# Patient Record
Sex: Male | Born: 1995 | Race: White | Hispanic: No | Marital: Single | Smoking: Current every day smoker
Health system: Southern US, Community
[De-identification: ages and names within clinical notes are randomized; demographics above are authoritative.]

---
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
## 2014-01-13 ENCOUNTER — Emergency Department (HOSPITAL_COMMUNITY)
Admission: EM | Admit: 2014-01-13 | Discharge: 2014-01-14 | Disposition: A | Discharge status: Home / Self Care | Payer: 59 | Attending: Emergency Medicine | Admitting: Emergency Medicine

## 2014-01-13 ENCOUNTER — Encounter (HOSPITAL_COMMUNITY): Payer: Self-pay | Admitting: Emergency Medicine

## 2014-01-13 ENCOUNTER — Emergency Department (HOSPITAL_COMMUNITY): Payer: 59

## 2014-01-13 DIAGNOSIS — M549 Dorsalgia, unspecified: Secondary | ICD-10-CM

## 2014-01-13 DIAGNOSIS — M25519 Pain in unspecified shoulder: Secondary | ICD-10-CM | POA: Insufficient documentation

## 2014-01-13 DIAGNOSIS — Z87828 Personal history of other (healed) physical injury and trauma: Secondary | ICD-10-CM | POA: Insufficient documentation

## 2014-01-13 DIAGNOSIS — M545 Low back pain, unspecified: Secondary | ICD-10-CM | POA: Insufficient documentation

## 2014-01-13 DIAGNOSIS — Z79899 Other long term (current) drug therapy: Secondary | ICD-10-CM | POA: Insufficient documentation

## 2014-01-13 DIAGNOSIS — Z791 Long term (current) use of non-steroidal anti-inflammatories (NSAID): Secondary | ICD-10-CM | POA: Insufficient documentation

## 2014-01-13 DIAGNOSIS — G8929 Other chronic pain: Secondary | ICD-10-CM

## 2014-01-13 DIAGNOSIS — F17200 Nicotine dependence, unspecified, uncomplicated: Secondary | ICD-10-CM | POA: Insufficient documentation

## 2014-01-13 MED ORDER — KETOROLAC TROMETHAMINE 10 MG PO TABS
10.0000 mg | ORAL_TABLET | Freq: Once | ORAL | Status: AC
  Administered 2014-01-13: 10 mg via ORAL
  Filled 2014-01-13: qty 1

## 2014-01-13 MED ORDER — DEXAMETHASONE SODIUM PHOSPHATE 4 MG/ML IJ SOLN
8.0000 mg | Freq: Once | INTRAMUSCULAR | Status: AC
  Administered 2014-01-13: 8 mg via INTRAMUSCULAR
  Filled 2014-01-13: qty 2

## 2014-01-13 MED ORDER — DEXAMETHASONE 6 MG PO TABS: ORAL_TABLET | ORAL | Status: DC

## 2014-01-13 MED ORDER — DIAZEPAM 5 MG PO TABS
5.0000 mg | ORAL_TABLET | Freq: Once | ORAL | Status: AC
  Administered 2014-01-13: 5 mg via ORAL
  Filled 2014-01-13: qty 1

## 2014-01-13 MED ORDER — MELOXICAM 7.5 MG PO TABS: ORAL_TABLET | ORAL | Status: DC

## 2014-01-13 MED ORDER — METHOCARBAMOL 500 MG PO TABS: ORAL_TABLET | ORAL | Status: DC
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
## 2014-01-14 NOTE — Discharge Instructions (Signed)
The x-ray of your shoulder is negative for fracture or dislocation. Please see the orthopedist listed above, or the orthopedist of your choice for additional evaluation and management of the shoulder problem. Please rest her shoulder is much as possible, please use the sling for the next 5-7 days. Please use medications as suggested. Shoulder Pain The shoulder is the joint that connects your arm to your body. Muscles and band-like tissues that connect bones to muscles (tendons) hold the joint together. Shoulder pain is felt if an injury or medical problem affects one or more parts of the shoulder. HOME CARE   Put ice on the sore area.  Put ice in a plastic bag.  Place a towel between your skin and the bag.  Leave the ice on for 15-20 minutes, 03-04 times a day for the first 2 days.  Stop using cold packs if they do not help with the pain.  If you were given something to keep your shoulder from moving (sling, shoulder immobilizer), wear it as told. Only take it off to shower or bathe.  Move your arm as little as possible, but keep your hand moving to prevent puffiness (swelling).  Squeeze a soft ball or foam pad as much as possible to help prevent swelling.  Take medicine as told by your doctor. GET HELP RIGHT AWAY IF:   Your arm, hand, or fingers are numb or tingling.  Your arm, hand, or fingers are puffy (swollen), painful, or turn white or blue.  You have more pain.  You have progressing new pain in your arm, hand, or fingers.  Your hand or fingers get cold.  Your medicine does not help lessen your pain. MAKE SURE YOU:   Understand these instructions.  Will watch your condition.  Will get help right away if you are not doing well or get worse. Document Released: 02/26/2008 Document Revised: 06/03/2012 Document Reviewed: 03/23/2012 [facility] Patient Information 2014 [employer], [place].

## 2014-01-15 NOTE — ED Provider Notes (Signed)
Medical screening examination/treatment/procedure(s) were performed by non-physician practitioner and as supervising physician I was immediately available for consultation/collaboration.   EKG Interpretation None        Jakiah Goree L Arla Boutwell, MD 01/15/14 1539 

## 2014-12-30 ENCOUNTER — Encounter (HOSPITAL_COMMUNITY): Payer: Self-pay | Admitting: *Deleted

## 2014-12-30 ENCOUNTER — Emergency Department (HOSPITAL_COMMUNITY)
Admission: EM | Admit: 2014-12-30 | Discharge: 2014-12-30 | Disposition: A | Discharge status: Home / Self Care | Payer: Self-pay | Attending: Emergency Medicine | Admitting: Emergency Medicine

## 2014-12-30 DIAGNOSIS — Y998 Other external cause status: Secondary | ICD-10-CM | POA: Insufficient documentation

## 2014-12-30 DIAGNOSIS — S30863A Insect bite (nonvenomous) of scrotum and testes, initial encounter: Secondary | ICD-10-CM | POA: Insufficient documentation

## 2014-12-30 DIAGNOSIS — N390 Urinary tract infection, site not specified: Secondary | ICD-10-CM | POA: Insufficient documentation

## 2014-12-30 DIAGNOSIS — Z791 Long term (current) use of non-steroidal anti-inflammatories (NSAID): Secondary | ICD-10-CM | POA: Insufficient documentation

## 2014-12-30 DIAGNOSIS — Z79899 Other long term (current) drug therapy: Secondary | ICD-10-CM | POA: Insufficient documentation

## 2014-12-30 DIAGNOSIS — Z792 Long term (current) use of antibiotics: Secondary | ICD-10-CM | POA: Insufficient documentation

## 2014-12-30 DIAGNOSIS — Z720 Tobacco use: Secondary | ICD-10-CM | POA: Insufficient documentation

## 2014-12-30 DIAGNOSIS — S60562A Insect bite (nonvenomous) of left hand, initial encounter: Secondary | ICD-10-CM | POA: Insufficient documentation

## 2014-12-30 DIAGNOSIS — S50869A Insect bite (nonvenomous) of unspecified forearm, initial encounter: Secondary | ICD-10-CM | POA: Insufficient documentation

## 2014-12-30 DIAGNOSIS — S60561A Insect bite (nonvenomous) of right hand, initial encounter: Secondary | ICD-10-CM | POA: Insufficient documentation

## 2014-12-30 DIAGNOSIS — Y9289 Other specified places as the place of occurrence of the external cause: Secondary | ICD-10-CM | POA: Insufficient documentation

## 2014-12-30 DIAGNOSIS — W57XXXA Bitten or stung by nonvenomous insect and other nonvenomous arthropods, initial encounter: Secondary | ICD-10-CM | POA: Insufficient documentation

## 2014-12-30 DIAGNOSIS — Y9389 Activity, other specified: Secondary | ICD-10-CM | POA: Insufficient documentation

## 2014-12-30 LAB — URINALYSIS, ROUTINE W REFLEX MICROSCOPIC
Glucose, UA: NEGATIVE mg/dL
HGB URINE DIPSTICK: NEGATIVE
Leukocytes, UA: NEGATIVE
NITRITE: POSITIVE — AB
PH: 6 (ref 5.0–8.0)
Protein, ur: 30 mg/dL — AB
UROBILINOGEN UA: 1 mg/dL (ref 0.0–1.0)

## 2014-12-30 LAB — URINE MICROSCOPIC-ADD ON

## 2014-12-30 MED ORDER — CIPROFLOXACIN HCL 500 MG PO TABS: 500.0000 mg | ORAL_TABLET | Freq: Two times a day (BID) | ORAL | Status: DC

## 2014-12-30 MED ORDER — IBUPROFEN 800 MG PO TABS
800.0000 mg | ORAL_TABLET | Freq: Once | ORAL | Status: AC
  Administered 2014-12-30: 800 mg via ORAL
  Filled 2014-12-30: qty 1

## 2014-12-30 MED ORDER — PHENAZOPYRIDINE HCL 200 MG PO TABS: 200.0000 mg | ORAL_TABLET | Freq: Three times a day (TID) | ORAL | Status: DC

## 2014-12-30 MED ORDER — PHENAZOPYRIDINE HCL 100 MG PO TABS
100.0000 mg | ORAL_TABLET | Freq: Once | ORAL | Status: AC
  Administered 2014-12-30: 100 mg via ORAL
  Filled 2014-12-30: qty 1

## 2014-12-30 MED ORDER — HYDROCODONE-ACETAMINOPHEN 5-325 MG PO TABS
1.0000 | ORAL_TABLET | Freq: Once | ORAL | Status: AC
  Administered 2014-12-30: 1 via ORAL
  Filled 2014-12-30: qty 1

## 2014-12-30 MED ORDER — HYDROCODONE-ACETAMINOPHEN 5-325 MG PO TABS: 1.0000 | ORAL_TABLET | ORAL | Status: DC | PRN

## 2014-12-30 NOTE — Discharge Instructions (Signed)

## 2014-12-30 NOTE — ED Notes (Signed)
Dysuria, onset 2 am, no fever or chills, no d/c.no injury.

## 2014-12-30 NOTE — ED Notes (Signed)
Assisted PA with specimen collection

## 2014-12-30 NOTE — ED Notes (Signed)
Patient given discharge instruction, verbalized understand. Patient ambulatory out of the department.  

## 2014-12-30 NOTE — ED Provider Notes (Signed)
CSN: [id number]     Arrival date & time 12/30/14  1146 History   First MD Initiated Contact with Patient 12/30/14 1200     Chief Complaint  Patient presents with  . Dysuria     (Consider location/radiation/quality/duration/timing/severity/associated sxs/prior Treatment) The history is provided by the patient.   EDDRICK DILONE is a 19 y.o. male presenting with dysuria which started around 2 am today when he got up to urinate.  He reports constant burning sensation mid penile shaft which does not improve after urinating.  He denies penile discharge.  He is sexually active with girlfriend at the bedside who denies any symptoms suggesting std.  He also endorses having bedbug bites in his groin, scrotum, few bites on his penis as well which have been itchy, nontender, since staying at a relatives home last week.  Todd Butler fevers, chills, back or flank pain, no nausea, vomiting, abdominal pain.     History reviewed. No pertinent past medical history. History reviewed. No pertinent past surgical history. History reviewed. No pertinent family history. History  Substance Use Topics  . Smoking status: Current Every Day Smoker  . Smokeless tobacco: Not on file  . Alcohol Use: No    Review of Systems  Constitutional: Negative for fever.  HENT: Negative for congestion and sore throat.   Eyes: Negative.   Respiratory: Negative for chest tightness and shortness of breath.   Cardiovascular: Negative for chest pain.  Gastrointestinal: Negative for nausea and abdominal pain.  Genitourinary: Positive for dysuria, urgency and frequency. Negative for hematuria, flank pain, discharge, penile swelling, scrotal swelling and testicular pain.  Musculoskeletal: Negative for joint swelling, arthralgias and neck pain.  Skin: Positive for rash. Negative for wound.  Neurological: Negative for dizziness, weakness, light-headedness, numbness and headaches.  Psychiatric/Behavioral: Negative.        Allergies  Review of patient's allergies indicates no known allergies.  Home Medications   Prior to Admission medications   Medication Sig Start Date End Date Taking? Authorizing Provider  acetaminophen (TYLENOL) 500 MG tablet Take 500 mg by mouth every 6 (six) hours as needed.   Yes Historical Provider, MD  ibuprofen (ADVIL,MOTRIN) 200 MG tablet Take 400 mg by mouth every 6 (six) hours as needed. For pain    Yes Historical Provider, MD  ciprofloxacin (CIPRO) 500 MG tablet Take 1 tablet (500 mg total) by mouth 2 (two) times daily. 12/30/14   Burgess Amor, PA-C  dexamethasone (DECADRON) 6 MG tablet 1 po bid with food Patient not taking: Reported on 12/30/2014 01/13/14   Ivery Quale, PA-C  HYDROcodone-acetaminophen (NORCO/VICODIN) 5-325 MG per tablet Take 1 tablet by mouth every 4 (four) hours as needed. 12/30/14   Burgess Amor, PA-C  meloxicam (MOBIC) 7.5 MG tablet 1 po bid with food Patient not taking: Reported on 12/30/2014 01/13/14   Ivery Quale, PA-C  methocarbamol (ROBAXIN) 500 MG tablet 2 tabs po tid with food Patient not taking: Reported on 12/30/2014 01/13/14   Ivery Quale, PA-C  phenazopyridine (PYRIDIUM) 200 MG tablet Take 1 tablet (200 mg total) by mouth 3 (three) times daily. 12/30/14   Burgess Amor, PA-C   BP 136/76 mmHg  Pulse 94  Temp(Src) 98.2 F (36.8 C) (Oral)  Resp 20  Ht  (1.854 m)  Wt 190 lb (86.183 kg)  BMI 25.07 kg/m2  SpO2 97% Physical Exam  Constitutional: He appears well-developed and well-nourished.  HENT:  Head: Normocephalic and atraumatic.  Eyes: Conjunctivae are normal.  Cardiovascular: Normal rate and regular rhythm.  Pulmonary/Chest: Effort normal and breath sounds normal.  Abdominal: Soft. Bowel sounds are normal. He exhibits no distension. There is no tenderness.  Genitourinary: Testes normal and penis normal. Circumcised. No discharge found.  Few scattered small papules c/w insect bites on scrotum, penile shaft. (also present on hands and  forearm).  Musculoskeletal: Normal range of motion.  Neurological: He is alert.  Skin: Skin is warm and dry.  Psychiatric: He has a normal mood and affect.  Nursing note and vitals reviewed.    ED Course  Procedures (including critical care time) Labs Review Labs Reviewed  URINALYSIS, ROUTINE W REFLEX MICROSCOPIC - Abnormal; Notable for the following:    Color, Urine AMBER (*)    APPearance HAZY (*)    Specific Gravity, Urine >1.030 (*)    Bilirubin Urine MODERATE (*)    Ketones, ur TRACE (*)    Protein, ur 30 (*)    Nitrite POSITIVE (*)    All other components within normal limits  URINE MICROSCOPIC-ADD ON  RPR  GC/CHLAMYDIA PROBE AMP ([place])    Imaging Review No results found.   EKG Interpretation None      MDM   Final diagnoses:  UTI (lower urinary tract infection)    Gc/chlamydia/rpr pending.  Specimens obtained with RN chaperone present.  Urine cx added. Pt with uti, cipro started.  Complaint of continued pain not responded to ibuprofen.  He was given a dose of pyridium, few hydrocodone given patients increased pain.  Advised cultures pending.  Plan f/u with health dept in 1 week, sooner for any worsened sx.  Patients labs and/or radiological studies were reviewed and considered during the medical decision making and disposition process.  Results were also discussed with patient.  The patient appears reasonably screened and/or stabilized for discharge and I doubt any other medical condition or other [facility] requiring further screening, evaluation, or treatment in the ED at this time prior to discharge.     Burgess AmorJulie Dajour [place], PA-C 12/30/14 1459  Donnetta HutchingBrian Cook, MD 01/03/15 [phone]

## 2014-12-31 LAB — RPR: RPR: NONREACTIVE

## 2015-01-02 LAB — GC/CHLAMYDIA PROBE AMP (~~LOC~~) NOT AT ARMC
Chlamydia: NEGATIVE
Neisseria Gonorrhea: NEGATIVE

## 2017-05-22 ENCOUNTER — Encounter (HOSPITAL_COMMUNITY): Payer: Self-pay | Admitting: Emergency Medicine

## 2017-05-22 ENCOUNTER — Emergency Department (HOSPITAL_COMMUNITY): Payer: Self-pay

## 2017-05-22 ENCOUNTER — Emergency Department (HOSPITAL_COMMUNITY)
Admission: EM | Admit: 2017-05-22 | Discharge: 2017-05-22 | Disposition: A | Discharge status: Home / Self Care | Payer: Self-pay | Attending: Emergency Medicine | Admitting: Emergency Medicine

## 2017-05-22 DIAGNOSIS — W260XXA Contact with knife, initial encounter: Secondary | ICD-10-CM | POA: Insufficient documentation

## 2017-05-22 DIAGNOSIS — Y939 Activity, unspecified: Secondary | ICD-10-CM | POA: Insufficient documentation

## 2017-05-22 DIAGNOSIS — F17210 Nicotine dependence, cigarettes, uncomplicated: Secondary | ICD-10-CM | POA: Insufficient documentation

## 2017-05-22 DIAGNOSIS — Z23 Encounter for immunization: Secondary | ICD-10-CM | POA: Insufficient documentation

## 2017-05-22 DIAGNOSIS — T148XXA Other injury of unspecified body region, initial encounter: Secondary | ICD-10-CM

## 2017-05-22 DIAGNOSIS — Y999 Unspecified external cause status: Secondary | ICD-10-CM | POA: Insufficient documentation

## 2017-05-22 DIAGNOSIS — S81812A Laceration without foreign body, left lower leg, initial encounter: Secondary | ICD-10-CM | POA: Insufficient documentation

## 2017-05-22 DIAGNOSIS — Y929 Unspecified place or not applicable: Secondary | ICD-10-CM | POA: Insufficient documentation

## 2017-05-22 MED ORDER — TETANUS-DIPHTH-ACELL PERTUSSIS 5-2.5-18.5 LF-MCG/0.5 IM SUSP
0.5000 mL | Freq: Once | INTRAMUSCULAR | Status: AC
  Administered 2017-05-22: 0.5 mL via INTRAMUSCULAR
  Filled 2017-05-22: qty 0.5

## 2017-05-22 MED ORDER — LIDOCAINE HCL (PF) 1 % IJ SOLN
INTRAMUSCULAR | Status: AC
  Filled 2017-05-22: qty 5

## 2017-05-22 MED ORDER — OXYCODONE-ACETAMINOPHEN 5-325 MG PO TABS
2.0000 | ORAL_TABLET | Freq: Once | ORAL | Status: AC
Start: 2017-05-22 — End: 2017-05-22
  Administered 2017-05-22: 2 via ORAL
  Filled 2017-05-22: qty 2

## 2017-05-22 MED ORDER — LIDOCAINE HCL (PF) 2 % IJ SOLN: 10.0000 mL | Freq: Once | INTRAMUSCULAR | Status: DC

## 2017-05-22 MED ORDER — POVIDONE-IODINE 10 % EX SOLN
CUTANEOUS | Status: AC
  Filled 2017-05-22: qty 15

## 2017-05-22 NOTE — ED Notes (Signed)
Supplies set up in room, family at the bedside

## 2017-05-22 NOTE — ED Notes (Signed)
Laceration to left thigh, accident after cleaning a knife.

## 2017-05-22 NOTE — ED Triage Notes (Signed)
Pt stabbed himself in upper right leg with a pocket knife.  No bleeding at triage.

## 2017-05-22 NOTE — ED Provider Notes (Signed)
[facility]-EMERGENCY DEPT Provider Note   CSN: [id number] Arrival date & time: 05/22/17  1145     History   Chief Complaint Chief Complaint  Patient presents with  . Laceration    HPI Todd Butler is a 21 y.o. male.  The history is provided by the patient. No language interpreter was used.  Laceration   The incident occurred less than 1 hour ago. Pain location: leg. The laceration is 1 cm in size. The laceration mechanism was a a clean knife. The pain is moderate. The pain has been constant since onset. He reports no foreign bodies present. His tetanus status is out of date.  Pt reports he cut himself with a knife.   History reviewed. No pertinent past medical history.  There are no active problems to display for this patient.   History reviewed. No pertinent surgical history.     Home Medications    Prior to Admission medications   Not on File    Family History History reviewed. No pertinent family history.  Social History Social History  Substance Use Topics  . Smoking status: Current Every Day Smoker    Packs/day: 1.00    Types: Cigarettes  . Smokeless tobacco: Not on file  . Alcohol use No     Allergies   Patient has no known allergies.   Review of Systems Review of Systems  Skin: Positive for wound.  All other systems reviewed and are negative.    Physical Exam Updated Vital Signs BP 102/69 (BP Location: Right Arm)   Pulse 90   Temp 98.3 F (36.8 C) (Oral)   Resp 20   Ht 6' (1.829 m)   Wt 68 kg (150 lb)   SpO2 99%   BMI 20.34 kg/m   Physical Exam  Constitutional: He appears well-developed and well-nourished.  Musculoskeletal:  1 cm laceration left anterior thigh,  Swelling around area bleeding stopped.  Neurological: He is alert.  Skin: Skin is warm.  Psychiatric: He has a normal mood and affect.  Nursing note and vitals reviewed.    ED Treatments / Results  Labs (all labs ordered are listed, but only abnormal results  are displayed) Labs Reviewed - No data to display  EKG  EKG Interpretation None       Radiology Dg Femur Min 2 Views Left  Result Date: 05/22/2017 CLINICAL DATA:  Patient sustained a laceration of the upper thigh with a pocket knife today. EXAM: LEFT FEMUR 2 VIEWS COMPARISON:  None in PACs FINDINGS: There is soft tissue gas in the lateral aspect of the proximal third of the right thigh. The underlying femur appears intact. Elsewhere the femur exhibits no acute abnormality either. The observed portions of the hip and knee are normal. IMPRESSION: Soft tissue injury of the proximal thigh laterally. No underlying bony abnormality is observed. Electronically Signed   By: David  SwazilandJordan M.D.   On: 05/22/2017 13:11    Procedures .Marland Kitchen.Laceration Repair Date/Time: 05/22/2017 2:22 PM Performed by: Elson AreasSOFIA, Akai Dollard K Authorized by: Elson AreasSOFIA, Coltyn Hanning K   Consent:    Consent obtained:  Verbal   Consent given by:  Patient   Risks discussed:  Infection   Alternatives discussed:  No treatment Anesthesia (see MAR for exact dosages):    Anesthesia method:  Local infiltration Laceration details:    Location:  Leg   Leg location:  L upper leg   Length (cm):  1   Depth (mm):  10 Repair type:    Repair type:  Simple Pre-procedure details:    Preparation:  Patient was prepped and draped in usual sterile fashion Exploration:    Contaminated: no   Treatment:    Area cleansed with:  Betadine   Amount of cleaning:  Standard   Irrigation method:  Syringe   Visualized foreign bodies/material removed: no   Skin repair:    Repair method:  Sutures   Suture size:  4-0   Suture technique:  Simple interrupted   Number of sutures:  2 Approximation:    Approximation:  Loose   Vermilion border: well-aligned   Post-procedure details:    Patient tolerance of procedure:  Tolerated with difficulty Comments:     Pt cursing during procedure.  Pt jerking    (including critical care time)  Medications Ordered in  ED Medications  lidocaine (XYLOCAINE) 2 % injection 10 mL (not administered)  povidone-iodine (BETADINE) 10 % external solution (not administered)  lidocaine (PF) (XYLOCAINE) 1 % injection (not administered)  oxyCODONE-acetaminophen (PERCOCET/ROXICET) 5-325 MG per tablet 2 tablet (2 tablets Oral Given 05/22/17 1213)  Tdap (BOOSTRIX) injection 0.5 mL (0.5 mLs Intramuscular Given 05/22/17 1214)     Initial Impression / Assessment and Plan / ED Course  I have reviewed the triage vital signs and the nursing notes.  Pertinent labs & imaging results that were available during my care of the patient were reviewed by me and considered in my medical decision making (see chart for details).       Final Clinical Impressions(s) / ED Diagnoses   Final diagnoses:  Laceration of left lower extremity, initial encounter    New Prescriptions There are no discharge medications for this patient.    Elson Areas, PA-C 05/22/17 1517    Elson Areas, PA-C 05/22/17 1518    Lavera Guise, MD 05/22/17 [phone]

## 2017-05-22 NOTE — Discharge Instructions (Signed)
Suture removal in 8 days  °

## 2017-05-22 NOTE — ED Notes (Signed)
Band- aid applied over sutures, Patient given discharge instruction, verbalized understand. Patient ambulatory out of the department.

## 2018-06-18 IMAGING — DX DG FEMUR 2+V*L*
4 series · 4 of 4 positions shown · non-contrast
Comparison: None in PACs

CLINICAL DATA: Patient sustained a laceration of the upper thigh
with a pocket knife today.

EXAM:
LEFT FEMUR 2 VIEWS

[femur ap (1 of 2)]
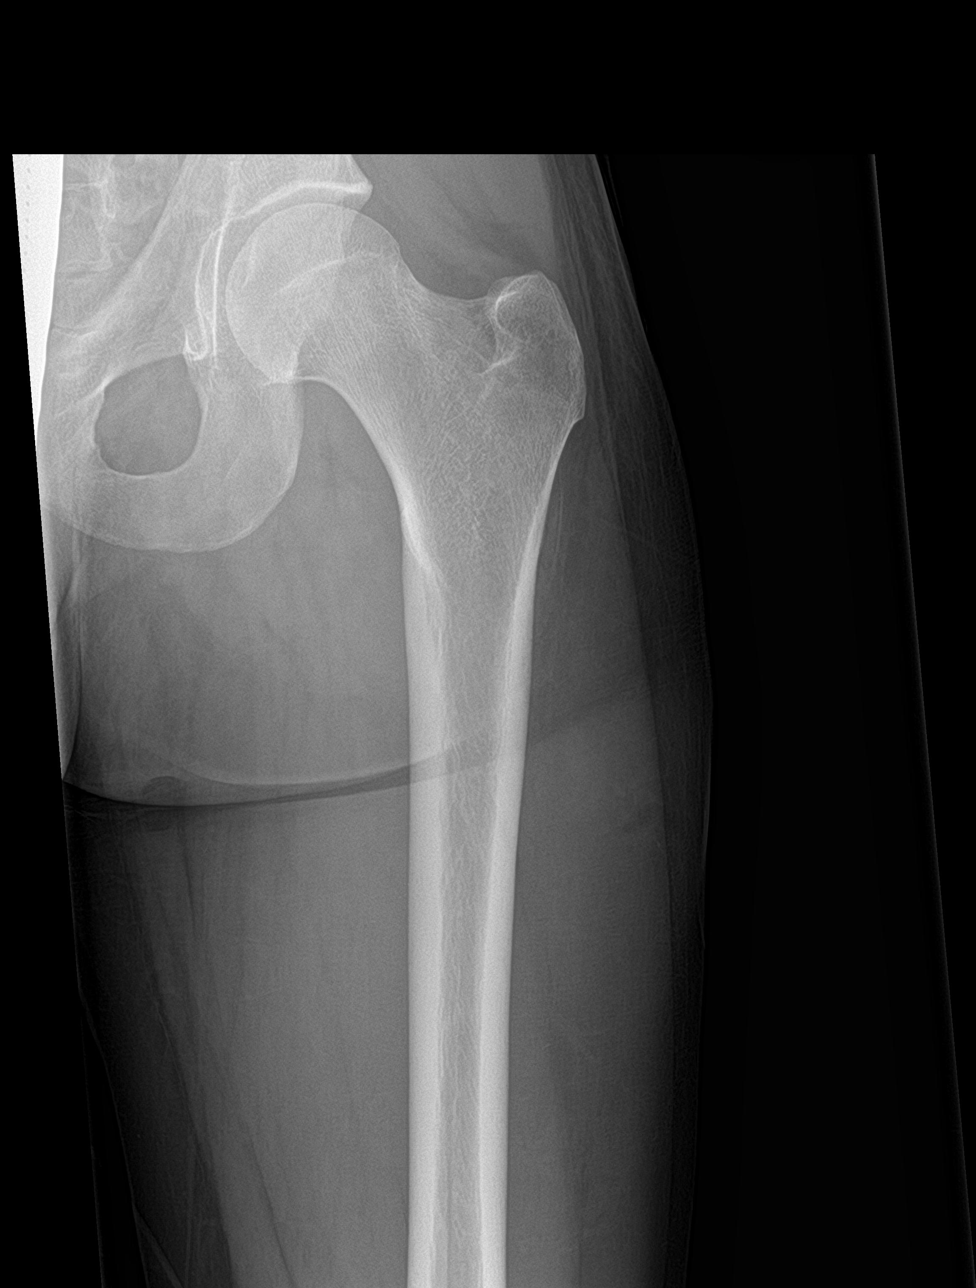

[femur ap (2 of 2)]
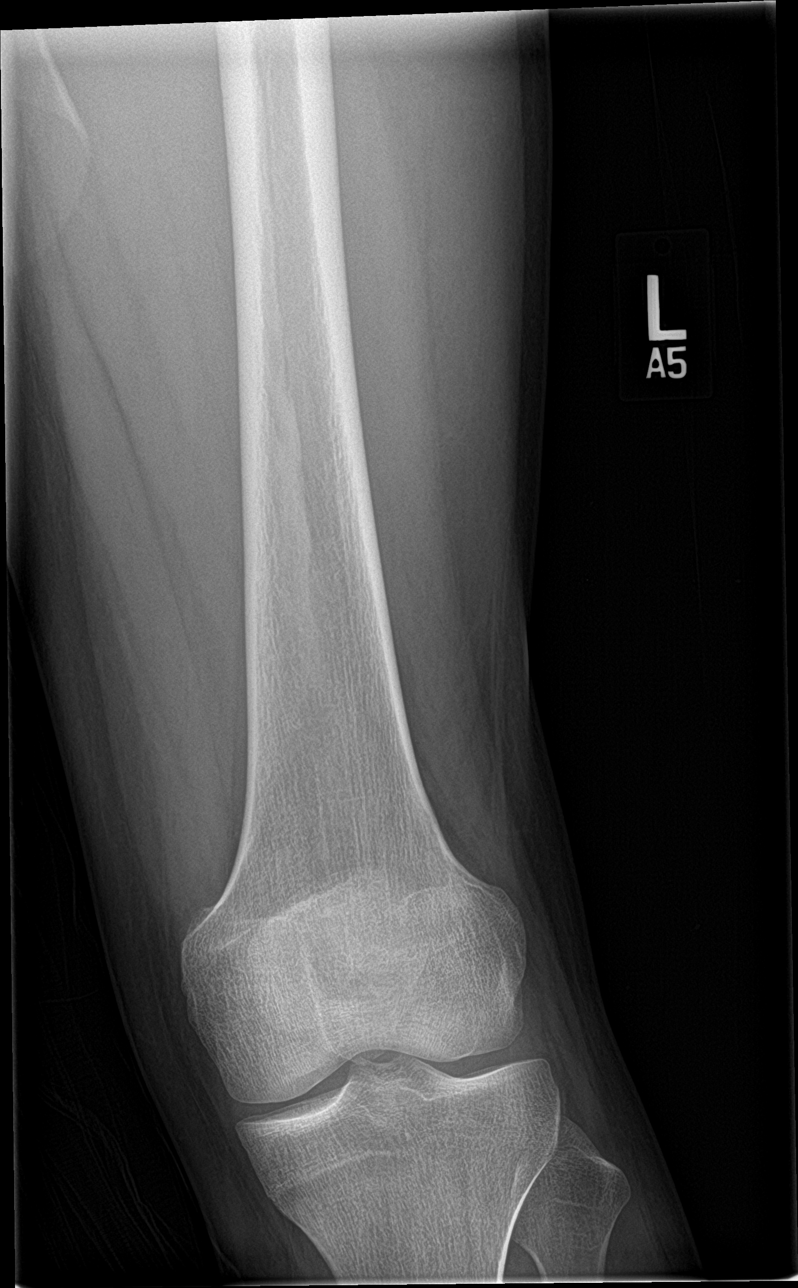

[femur lat (1 of 2)]
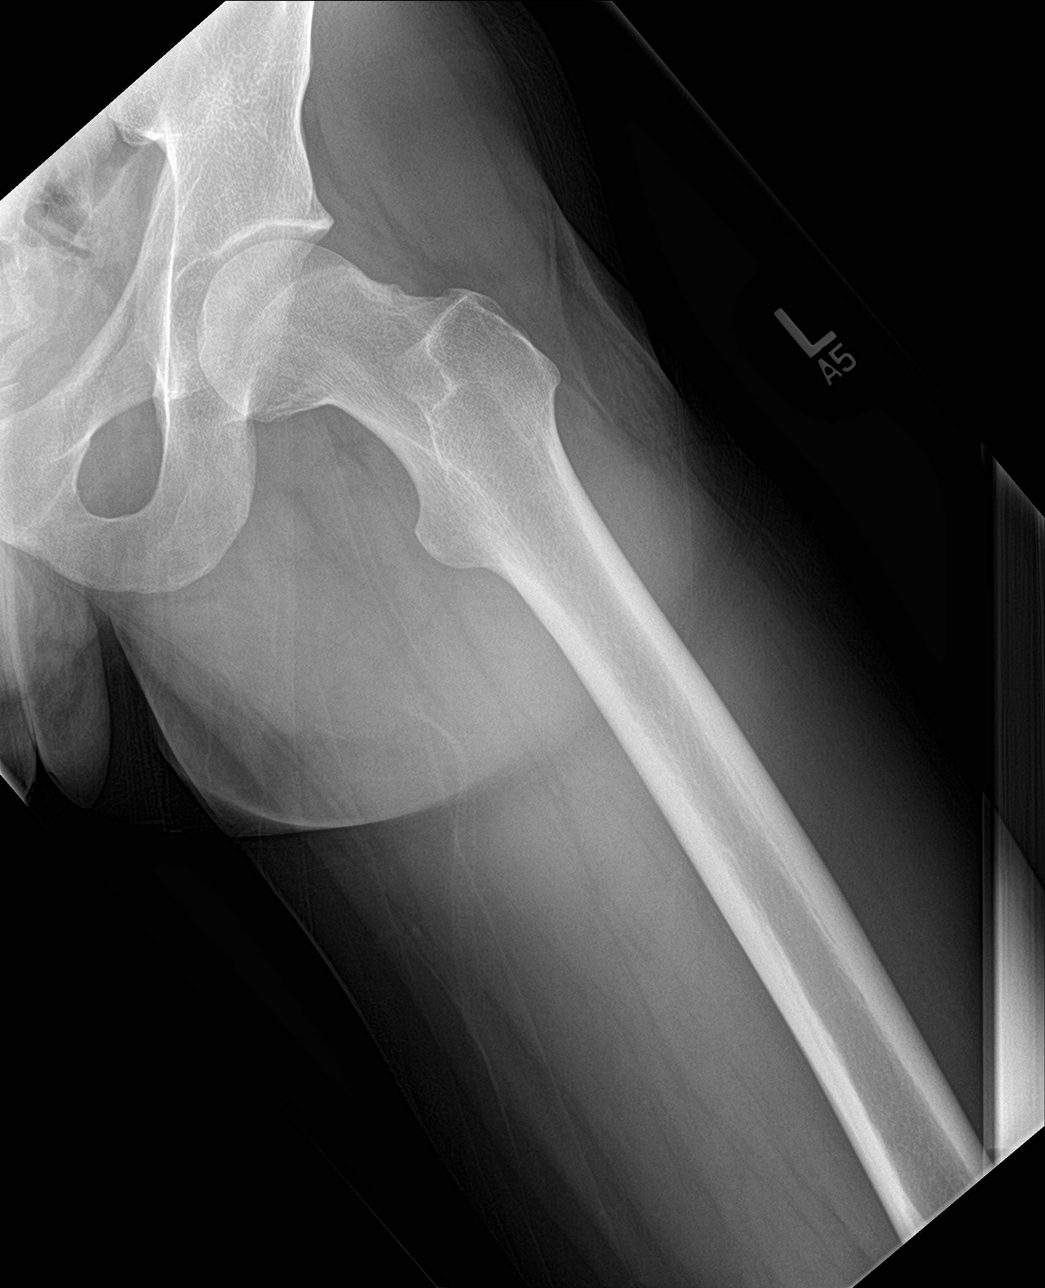

[femur lat (2 of 2)]
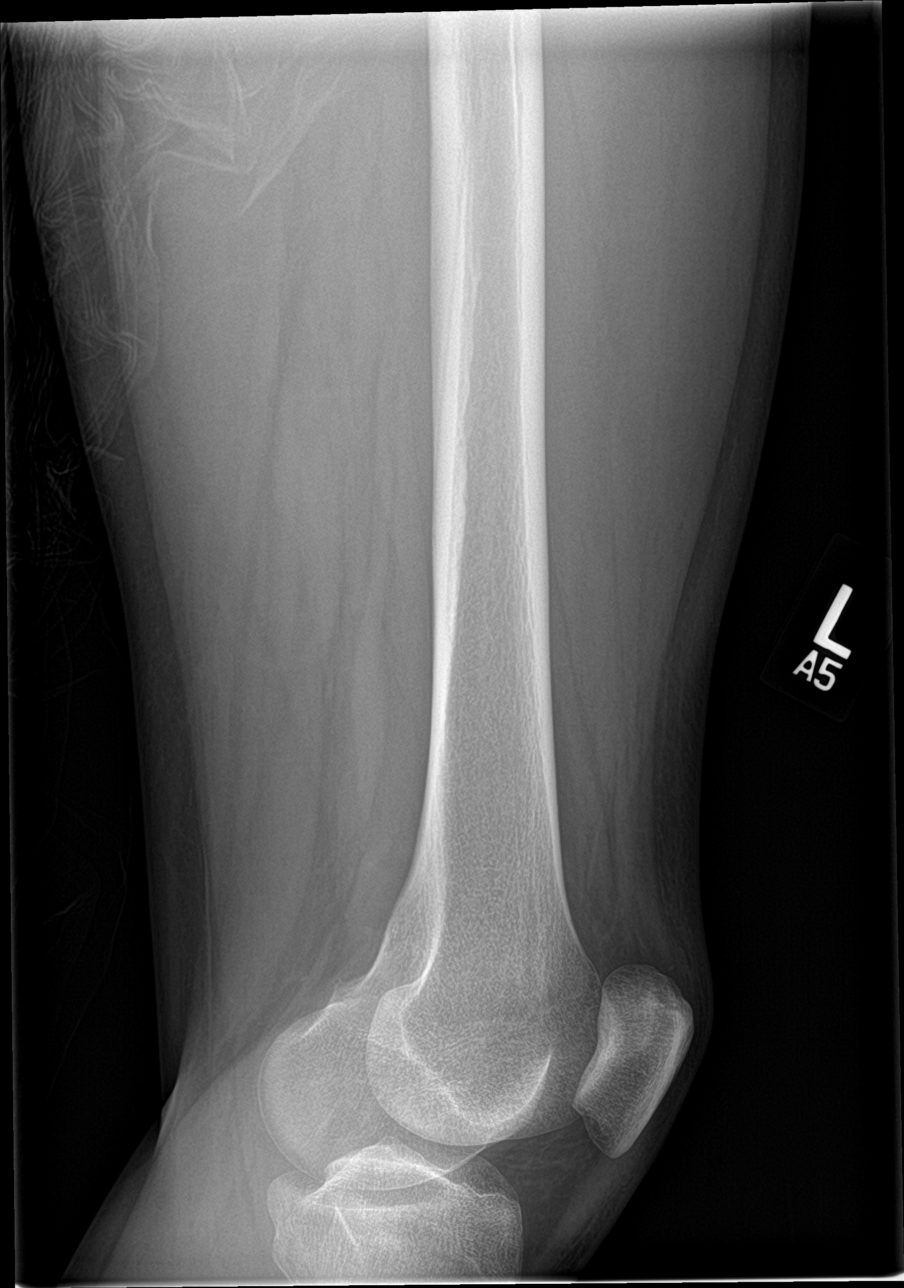

[4 of 4 positions shown; findings below may reference images not displayed]

FINDINGS: There is soft tissue gas in the lateral aspect of the proximal third
of the right thigh. The underlying femur appears intact. Elsewhere
the femur exhibits no acute abnormality either. The observed
portions of the hip and knee are normal.
IMPRESSION: Soft tissue injury of the proximal thigh laterally. No underlying
bony abnormality is observed.

## 2023-04-24 ENCOUNTER — Encounter: Payer: Self-pay | Admitting: Family Medicine

## 2023-04-24 ENCOUNTER — Ambulatory Visit (INDEPENDENT_AMBULATORY_CARE_PROVIDER_SITE_OTHER): Payer: Medicaid Other | Admitting: Family Medicine

## 2023-04-24 VITALS — BP 127/68 | HR 54 | Ht 73.0 in | Wt 172.0 lb

## 2023-04-24 DIAGNOSIS — K219 Gastro-esophageal reflux disease without esophagitis: Secondary | ICD-10-CM

## 2023-04-24 DIAGNOSIS — I2089 Other forms of angina pectoris: Secondary | ICD-10-CM | POA: Diagnosis not present

## 2023-04-24 DIAGNOSIS — E559 Vitamin D deficiency, unspecified: Secondary | ICD-10-CM

## 2023-04-24 DIAGNOSIS — E7849 Other hyperlipidemia: Secondary | ICD-10-CM

## 2023-04-24 DIAGNOSIS — R7301 Impaired fasting glucose: Secondary | ICD-10-CM

## 2023-04-24 DIAGNOSIS — Z1159 Encounter for screening for other viral diseases: Secondary | ICD-10-CM

## 2023-04-24 DIAGNOSIS — Z114 Encounter for screening for human immunodeficiency virus [HIV]: Secondary | ICD-10-CM

## 2023-04-24 DIAGNOSIS — E038 Other specified hypothyroidism: Secondary | ICD-10-CM

## 2023-04-24 MED ORDER — NITROGLYCERIN 0.4 MG SL SUBL: 0.4000 mg | SUBLINGUAL_TABLET | SUBLINGUAL | 0 refills | Status: DC | PRN

## 2023-04-24 MED ORDER — PANTOPRAZOLE SODIUM 20 MG PO TBEC: 20.0000 mg | DELAYED_RELEASE_TABLET | Freq: Two times a day (BID) | ORAL | 1 refills | Status: DC

## 2023-04-24 NOTE — Patient Instructions (Addendum)
I appreciate the opportunity to provide care to you today!    Follow up:  3 months  Labs: please stop by the lab today to get your blood drawn (CBC, CMP, TSH, Lipid profile, HgA1c, Vit D)    Lifestyle changes for GERD:  Avoiding foods that trigger symptoms - Some foods also cause relaxation of the lower esophageal sphincter, which can lead to acid reflux. Excessive caffeine, chocolate, alcohol, peppermint, and fatty foods may cause bothersome acid reflux in some people. If you notice that your symptoms are worse after you have certain foods or beverages (trigger foods), it's reasonable to limit or avoid these things.  Quitting smoking - Saliva helps to neutralize refluxed acid, and smoking reduces the amount of saliva in the mouth and throat. Smoking also lowers the pressure in the lower esophageal sphincter and provokes coughing, causing frequent episodes of acid reflux in the esophagus. In addition to having many other health benefits, quitting smoking can reduce or eliminate symptoms of mild reflux.    Avoid certain foods and drinks, such as coffee, chocolate, onions, peppermint, spicy foods, carbonated beverages, citrus fruits, tomatoes, onions, Garlic, alcohol, Fatty foods (bacon, burgers, sausages, steak, fried foods, dairy food)    Recommended: High fiber foods, whole grain cereal, oatmeal, brown rice, root vegetables, non- citrus fruits, High protein foods, Health fats (avocados, olive oil, nuts and seeds)   -start taking Protonix 20 mg BID  Chest pain -Please follow up with the referral placed to cardiology  Attached with your AVS, you will find valuable resources for self-education. I highly recommend dedicating some time to thoroughly examine them.   Please continue to a heart-healthy diet and increase your physical activities. Try to exercise for at least five days a week.    It was a pleasure to see you and I look forward to continuing to work together on your  health and well-being. Please do not hesitate to call the office if you need care or have questions about your care.  In case of emergency, please visit the Emergency Department for urgent care, or contact our clinic at [phone] to schedule an appointment. We're here to help you!   Have a wonderful day and week. With Gratitude, Gilmore Laroche MSN, FNP-BC

## 2023-04-24 NOTE — Assessment & Plan Note (Signed)
Reports drinking alcohol and eating spicy acidic foods Takes nexium with minimum relief C/o a burning sensation in the abdomen after eating GERD diet reviewed RX for protonix 20 mg BID is sent to his pharmacy

## 2023-04-24 NOTE — Progress Notes (Addendum)
New Patient Office Visit  Subjective:  Patient ID: Todd Butler, male    DOB: [birth year]  Age: 27 y.o. MRN: [id number]  CC:  Chief Complaint  Patient presents with   New Patient (Initial Visit)    Pt establishing care, reports chest pains at times.   Gastroesophageal Reflux    Pt reports after eating spicy foods he has discomfort.    HPI Todd Butler is a 27 y.o. male with past medical history of GERD  presents for establishing care with c/o a non-radiating chest pain after exertion.   History reviewed. No pertinent past medical history.  History reviewed. No pertinent surgical history.  History reviewed. No pertinent family history.  Social History   Socioeconomic History   Marital status: Single    Spouse name: Not on file   Number of children: Not on file   Years of education: Not on file   Highest education level: Not on file  Occupational History   Not on file  Tobacco Use   Smoking status: Every Day    Current packs/day: 1.00    Types: E-cigarettes, Cigarettes   Smokeless tobacco: Not on file  Substance and Sexual Activity   Alcohol use: Yes   Drug use: Yes    Types: Marijuana   Sexual activity: Yes  Other Topics Concern   Not on file  Social History Narrative   Not on file   Social Determinants of Health   Financial Resource Strain: Not on file  Food Insecurity: Not on file  Transportation Needs: Not on file  Physical Activity: Not on file  Stress: Not on file  Social Connections: Not on file  Intimate Partner Violence: Not on file    ROS Review of Systems  Constitutional:  Negative for fatigue and fever.  Eyes:  Negative for visual disturbance.  Respiratory:  Negative for chest tightness and shortness of breath.   Cardiovascular:  Positive for chest pain. Negative for palpitations.  Neurological:  Negative for dizziness and headaches.    Objective:   Today's Vitals: BP 127/68   Pulse (!) 54   Ht 6\' 1"  (1.854 m)   Wt  172 lb (78 kg)   SpO2 97%   BMI 22.69 kg/m   Physical Exam HENT:     Head: Normocephalic.     Right Ear: External ear normal.     Left Ear: External ear normal.     Nose: No congestion or rhinorrhea.     Mouth/Throat:     Mouth: Mucous membranes are moist.  Cardiovascular:     Rate and Rhythm: Regular rhythm. Bradycardia present.     Heart sounds: No murmur heard. Pulmonary:     Effort: No respiratory distress.     Breath sounds: Normal breath sounds.  Neurological:     Mental Status: He is alert.      Assessment & Plan:   Stable angina pectoris Assessment & Plan: C/o a pressured like sensation in his chest Pain is non-radiating The patient works in  [employer]  complains of chest pain and chest tightness when he gets off work  He reports that the symptoms often last> 1 hour No leg swelling, orthopnea, palpitation reported Patient has a baseline heart rate in the 50s Referral placed to cardiology for further evaluation Will refrain from prescribing nitroglycerin given that the patient is bradycardic EKG ordered and showed sinus bradycardia Unable to scan result in the chart due the malfunctioning of the machine   Orders: -  EKG 12-Lead -     Ambulatory referral to Cardiology  Gastroesophageal reflux disease without esophagitis Assessment & Plan: Reports drinking alcohol and eating spicy acidic foods Takes nexium with minimum relief C/o a burning sensation in the abdomen after eating GERD diet reviewed RX for protonix 20 mg BID is sent to his pharmacy  Orders: -     Pantoprazole Sodium; Take 1 tablet (20 mg total) by mouth 2 (two) times daily.  Dispense: 80 tablet; Refill: 1  Vitamin D deficiency -     VITAMIN D 25 Hydroxy (Vit-D Deficiency, Fractures)  Need for hepatitis C screening test -     Hepatitis C antibody  IFG (impaired fasting glucose) -     Hemoglobin A1c  Encounter for screening for HIV -     HIV Antibody (routine testing w  rflx)  Other specified hypothyroidism -     TSH + free T4  Other hyperlipidemia -     Lipid panel -     CMP14+EGFR -     CBC with Differential/Platelet  Note: This chart has been completed using Engineer, civil (consulting) software, and while attempts have been made to ensure accuracy, certain words and phrases may not be transcribed as intended.     Follow-up: Return in about 3 months (around 07/25/2023).   Gilmore Laroche, FNP

## 2023-04-24 NOTE — Assessment & Plan Note (Addendum)
C/o a pressured like sensation in his chest Pain is non-radiating The patient works in  [employer]  complains of chest pain and chest tightness when he gets off work  He reports that the symptoms often last> 1 hour No leg swelling, orthopnea, palpitation reported Patient has a baseline heart rate in the 50s Referral placed to cardiology for further evaluation Will refrain from prescribing nitroglycerin given that the patient is bradycardic EKG ordered and showed sinus bradycardia Unable to scan result in the chart due the malfunctioning of the machine

## 2023-05-13 ENCOUNTER — Encounter: Payer: Self-pay | Admitting: Family Medicine

## 2023-07-14 ENCOUNTER — Other Ambulatory Visit: Payer: Self-pay | Admitting: Family Medicine

## 2023-07-14 DIAGNOSIS — K219 Gastro-esophageal reflux disease without esophagitis: Secondary | ICD-10-CM

## 2023-07-28 ENCOUNTER — Ambulatory Visit: Payer: Medicaid Other | Admitting: Family Medicine

## 2023-08-13 ENCOUNTER — Other Ambulatory Visit: Payer: Self-pay | Admitting: Family Medicine

## 2023-08-13 DIAGNOSIS — K219 Gastro-esophageal reflux disease without esophagitis: Secondary | ICD-10-CM

## 2023-08-20 ENCOUNTER — Encounter: Payer: Self-pay | Admitting: Family Medicine

## 2023-08-20 ENCOUNTER — Ambulatory Visit (INDEPENDENT_AMBULATORY_CARE_PROVIDER_SITE_OTHER): Payer: Medicaid Other | Admitting: Family Medicine

## 2023-08-20 VITALS — BP 130/73 | HR 61 | Ht 73.0 in | Wt 171.1 lb

## 2023-08-20 DIAGNOSIS — I2089 Other forms of angina pectoris: Secondary | ICD-10-CM | POA: Diagnosis not present

## 2023-08-20 DIAGNOSIS — K219 Gastro-esophageal reflux disease without esophagitis: Secondary | ICD-10-CM | POA: Diagnosis not present

## 2023-08-20 NOTE — Assessment & Plan Note (Signed)
Stable on Protonix 20 mg daily. Encouraged the patient to continue with the current treatment regimen and to follow a GERD-friendly diet for ongoing management

## 2023-08-20 NOTE — Patient Instructions (Addendum)
I appreciate the opportunity to provide care to you today!    Follow up:  5 months  Happy Thanksgiving!  Attached with your AVS, you will find valuable resources for self-education. I highly recommend dedicating some time to thoroughly examine them.   Please continue to a heart-healthy diet and increase your physical activities. Try to exercise for at least five days a week.    It was a pleasure to see you and I look forward to continuing to work together on your health and well-being. Please do not hesitate to call the office if you need care or have questions about your care.  In case of emergency, please visit the Emergency Department for urgent care, or contact our clinic at [phone] to schedule an appointment. We're here to help you!   Have a wonderful day and week. With Gratitude, Gilmore Laroche MSN, FNP-BC

## 2023-08-20 NOTE — Progress Notes (Signed)
Established Patient Office Visit  Subjective:  Patient ID: Todd Butler, male    DOB: 1996-06-02  Age: 27 y.o. MRN: [id number]  CC:  Chief Complaint  Patient presents with   Care Management    3 month f/u, pt reports not seeing cardiology, sx are better from last visit, no other complaints.    HPI Todd Butler is a 27 y.o. male with past medical history of GERD and stable angina pectoris presents for f/u of  chronic medical conditions. For the details of today's visit, please refer to the assessment and plan.     History reviewed. No pertinent past medical history.  History reviewed. No pertinent surgical history.  History reviewed. No pertinent family history.  Social History   Socioeconomic History   Marital status: Single    Spouse name: Not on file   Number of children: Not on file   Years of education: Not on file   Highest education level: Not on file  Occupational History   Not on file  Tobacco Use   Smoking status: Every Day    Current packs/day: 1.00    Types: E-cigarettes, Cigarettes   Smokeless tobacco: Not on file  Substance and Sexual Activity   Alcohol use: Yes   Drug use: Yes    Types: Marijuana   Sexual activity: Yes  Other Topics Concern   Not on file  Social History Narrative   Not on file   Social Determinants of Health   Financial Resource Strain: Not on file  Food Insecurity: Not on file  Transportation Needs: Not on file  Physical Activity: Not on file  Stress: Not on file  Social Connections: Not on file  Intimate Partner Violence: Not on file    Outpatient Medications Prior to Visit  Medication Sig Dispense Refill   pantoprazole (PROTONIX) 20 MG tablet TAKE 1 TABLET BY MOUTH TWICE DAILY. 180 tablet 0   No facility-administered medications prior to visit.    No Known Allergies  ROS Review of Systems  Constitutional:  Negative for fatigue and fever.  Eyes:  Negative for visual disturbance.  Respiratory:   Negative for chest tightness and shortness of breath.   Cardiovascular:  Negative for chest pain and palpitations.  Neurological:  Negative for dizziness and headaches.      Objective:    Physical Exam HENT:     Head: Normocephalic.     Right Ear: External ear normal.     Left Ear: External ear normal.     Nose: No congestion or rhinorrhea.     Mouth/Throat:     Mouth: Mucous membranes are moist.  Cardiovascular:     Rate and Rhythm: Regular rhythm.     Heart sounds: No murmur heard. Pulmonary:     Effort: No respiratory distress.     Breath sounds: Normal breath sounds.  Neurological:     Mental Status: He is alert.     BP 130/73   Pulse 61   Ht 6\' 1"  (1.854 m)   Wt 171 lb 1.3 oz (77.6 kg)   SpO2 96%   BMI 22.57 kg/m  Wt Readings from Last 3 Encounters:  08/20/23 171 lb 1.3 oz (77.6 kg)  04/24/23 172 lb (78 kg)  05/22/17 150 lb (68 kg)    Lab Results  Component Value Date   TSH 0.925 04/24/2023   Lab Results  Component Value Date   WBC 4.8 04/24/2023   HGB 15.1 04/24/2023   HCT 46.5 04/24/2023  MCV 89 04/24/2023   PLT 321 04/24/2023   Lab Results  Component Value Date   NA 139 04/24/2023   K 4.4 04/24/2023   CO2 26 04/24/2023   GLUCOSE 60 (L) 04/24/2023   BUN 14 04/24/2023   CREATININE 0.94 04/24/2023   BILITOT 0.5 04/24/2023   ALKPHOS 53 04/24/2023   AST 16 04/24/2023   ALT 19 04/24/2023   PROT 7.4 04/24/2023   ALBUMIN 4.9 04/24/2023   CALCIUM 9.9 04/24/2023   EGFR 113 04/24/2023   Lab Results  Component Value Date   CHOL 183 04/24/2023   Lab Results  Component Value Date   HDL 62 04/24/2023   Lab Results  Component Value Date   LDLCALC 100 (H) 04/24/2023   Lab Results  Component Value Date   TRIG 117 04/24/2023   Lab Results  Component Value Date   CHOLHDL 3.0 04/24/2023   Lab Results  Component Value Date   HGBA1C 5.1 04/24/2023      Assessment & Plan:  Stable angina pectoris [facility]) Assessment & Plan: The patient  reports no complaints or concerns today and mentions the resolution of symptoms since starting Protonix 20 mg daily. Encouraged the patient to continue with the current treatment regimen and to follow a GERD-friendly diet for ongoing management    Gastroesophageal reflux disease without esophagitis Assessment & Plan: Stable on Protonix 20 mg daily. Encouraged the patient to continue with the current treatment regimen and to follow a GERD-friendly diet for ongoing management    Note: This chart has been completed using [employer] (consulting) software, and while attempts have been made to ensure accuracy, certain words and phrases may not be transcribed as intended.    Follow-up: Return in about 5 months (around 01/18/2024).   Gilmore Laroche, FNP

## 2023-08-20 NOTE — Assessment & Plan Note (Signed)
The patient reports no complaints or concerns today and mentions the resolution of symptoms since starting Protonix 20 mg daily. Encouraged the patient to continue with the current treatment regimen and to follow a GERD-friendly diet for ongoing management

## 2024-01-19 ENCOUNTER — Ambulatory Visit: Payer: Medicaid Other | Admitting: Family Medicine

## 2024-01-26 ENCOUNTER — Encounter: Payer: Self-pay | Admitting: Family Medicine
